# Patient Record
Sex: Male | Born: 2000
Health system: Southern US, Community
[De-identification: ages and names within clinical notes are randomized; demographics above are authoritative.]

---
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
## 2021-04-05 DIAGNOSIS — Z20822 Contact with and (suspected) exposure to covid-19: Secondary | ICD-10-CM | POA: Diagnosis not present

## 2021-04-21 ENCOUNTER — Ambulatory Visit: Payer: 59 | Attending: Family Medicine | Admitting: Pharmacist

## 2021-04-21 ENCOUNTER — Other Ambulatory Visit (HOSPITAL_COMMUNITY): Payer: Self-pay

## 2021-04-21 ENCOUNTER — Other Ambulatory Visit: Payer: Self-pay | Admitting: Pharmacist

## 2021-04-21 ENCOUNTER — Other Ambulatory Visit: Payer: Self-pay

## 2021-04-21 DIAGNOSIS — Z79899 Other long term (current) drug therapy: Secondary | ICD-10-CM

## 2021-04-21 DIAGNOSIS — H3562 Retinal hemorrhage, left eye: Secondary | ICD-10-CM | POA: Diagnosis not present

## 2021-04-21 MED ORDER — DUPIXENT 200 MG/1.14ML ~~LOC~~ SOSY
PREFILLED_SYRINGE | SUBCUTANEOUS | 11 refills | Status: AC
  Filled 2021-04-21: qty 2.28, fill #0

## 2021-04-21 MED ORDER — DUPIXENT 200 MG/1.14ML ~~LOC~~ SOSY: PREFILLED_SYRINGE | SUBCUTANEOUS | 11 refills | Status: DC

## 2021-04-21 NOTE — Progress Notes (Signed)
   S: Patient presents for review of their specialty medication therapy.  Patient is currently taking Dupixent for atopic dermatitis. Patient is managed by Dr. Illene Labrador for this. He has a visit upcoming this Friday (04/24/21) and will discuss potentially coming off of the medication. For now, he is still taking it as prescribed.   Adherence: reported  Efficacy: works well for him.   Dosing: 200 mg [place] every 14 days Dose adjustments: Renal: no dose adjustments (has not been studied) Hepatic: no dose adjustments (has not been studied)  Drug-drug interactions: no interactions present at this time  Screening: TB test: completed  Monitoring: S/sx of infection: none  S/sx of hypersensitivity: none S/sx of ocular effects: none  S/sx of eosinophilia/vasculitis: none   O:  No results found for: WBC, HGB, HCT, MCV, PLT    Chemistry   No results found for: NA, K, CL, CO2, BUN, CREATININE, GLU No results found for: CALCIUM, ALKPHOS, AST, ALT, BILITOT   A/P: 1. Medication review: Patient currently on Dupixent for atopic dermatitis. Reviewed the medication with the patient, including the following: Dupixent is a monoclonal antibody used for the treatment of asthma or atopic dermatitis. Patient educated on purpose, proper use and potential adverse effects of Dupixent. Possible adverse effects include increased risk of infection, ocular effects, vasculitis/eosinophilia, and hypersensitivity reactions. Administer as a SubQ injection and rotate sites. Allow the medication to reach room temp prior to administration (45 mins for 300 mg syringe or 30 min for 200 mg syringe). Do not shake. Discard any unused portion. Will reach out to [facility] to see if we can fax the doctor to switch Dupixent to the pen. No recommendations for any changes.   Butch Penny, PharmD, [place], CPP Clinical Pharmacist [facility] & [facility] [phone]

## 2021-04-24 DIAGNOSIS — L700 Acne vulgaris: Secondary | ICD-10-CM | POA: Diagnosis not present

## 2021-04-24 DIAGNOSIS — L209 Atopic dermatitis, unspecified: Secondary | ICD-10-CM | POA: Diagnosis not present

## 2021-04-29 DIAGNOSIS — L309 Dermatitis, unspecified: Secondary | ICD-10-CM | POA: Diagnosis not present

## 2021-04-29 DIAGNOSIS — Z1159 Encounter for screening for other viral diseases: Secondary | ICD-10-CM | POA: Diagnosis not present

## 2021-04-29 DIAGNOSIS — Z0000 Encounter for general adult medical examination without abnormal findings: Secondary | ICD-10-CM | POA: Diagnosis not present

## 2021-05-13 ENCOUNTER — Other Ambulatory Visit (HOSPITAL_COMMUNITY): Payer: Self-pay

## 2021-05-14 ENCOUNTER — Telehealth (INDEPENDENT_AMBULATORY_CARE_PROVIDER_SITE_OTHER): Payer: Self-pay

## 2021-05-14 NOTE — Telephone Encounter (Signed)
Copied from CRM [id number]. Topic: Appointment Scheduling - Scheduling Inquiry for Clinic >> May 12, 2021 12:26 PM Glean Salen wrote: Reason for WLN:[id number] mother called in to speak with Andrew Rodgers about medication, dupilumab (DUPIXENT) 200 MG/1. prefilled syringe. Please call back

## 2021-05-14 NOTE — Telephone Encounter (Signed)
Call placed to patient's mother. No answer. Left HIPAA-compliant VM with instructions to return my call.

## 2021-05-19 DIAGNOSIS — R748 Abnormal levels of other serum enzymes: Secondary | ICD-10-CM | POA: Diagnosis not present

## 2021-07-23 DIAGNOSIS — Z23 Encounter for immunization: Secondary | ICD-10-CM | POA: Diagnosis not present

## 2021-07-31 DIAGNOSIS — R748 Abnormal levels of other serum enzymes: Secondary | ICD-10-CM | POA: Diagnosis not present

## 2021-08-11 DIAGNOSIS — E875 Hyperkalemia: Secondary | ICD-10-CM | POA: Diagnosis not present

## 2021-08-11 DIAGNOSIS — R748 Abnormal levels of other serum enzymes: Secondary | ICD-10-CM | POA: Diagnosis not present

## 2021-08-12 ENCOUNTER — Other Ambulatory Visit: Payer: Self-pay | Admitting: Adult Medicine

## 2021-08-12 DIAGNOSIS — R748 Abnormal levels of other serum enzymes: Secondary | ICD-10-CM

## 2021-08-14 DIAGNOSIS — R748 Abnormal levels of other serum enzymes: Secondary | ICD-10-CM | POA: Diagnosis not present

## 2021-08-20 ENCOUNTER — Other Ambulatory Visit: Payer: 59

## 2021-08-28 DIAGNOSIS — R748 Abnormal levels of other serum enzymes: Secondary | ICD-10-CM | POA: Diagnosis not present

## 2021-10-14 ENCOUNTER — Other Ambulatory Visit (HOSPITAL_COMMUNITY): Payer: Self-pay

## 2022-01-04 ENCOUNTER — Other Ambulatory Visit (HOSPITAL_COMMUNITY): Payer: Self-pay

## 2022-01-15 ENCOUNTER — Other Ambulatory Visit (HOSPITAL_COMMUNITY): Payer: Self-pay

## 2022-01-20 ENCOUNTER — Other Ambulatory Visit (HOSPITAL_COMMUNITY): Payer: Self-pay

## 2022-01-21 ENCOUNTER — Other Ambulatory Visit (HOSPITAL_COMMUNITY): Payer: Self-pay

## 2022-02-15 ENCOUNTER — Other Ambulatory Visit (HOSPITAL_BASED_OUTPATIENT_CLINIC_OR_DEPARTMENT_OTHER): Payer: Self-pay

## 2022-02-15 MED ORDER — DUPIXENT 200 MG/1.14ML ~~LOC~~ SOAJ: SUBCUTANEOUS | 11 refills | Status: AC

## 2022-03-17 ENCOUNTER — Other Ambulatory Visit (HOSPITAL_COMMUNITY): Payer: Self-pay

## 2022-04-19 ENCOUNTER — Other Ambulatory Visit (HOSPITAL_COMMUNITY): Payer: Self-pay

## 2022-04-21 ENCOUNTER — Other Ambulatory Visit: Payer: Self-pay

## 2022-04-21 DIAGNOSIS — L209 Atopic dermatitis, unspecified: Secondary | ICD-10-CM | POA: Diagnosis not present

## 2022-04-21 MED ORDER — PIMECROLIMUS 1 % EX CREA
TOPICAL_CREAM | CUTANEOUS | 4 refills | Status: AC
  Filled 2022-04-21: qty 30, 20d supply, fill #0

## 2022-04-22 ENCOUNTER — Other Ambulatory Visit: Payer: Self-pay

## 2022-04-23 ENCOUNTER — Other Ambulatory Visit: Payer: Self-pay

## 2022-05-07 ENCOUNTER — Other Ambulatory Visit: Payer: Self-pay

## 2022-06-18 DIAGNOSIS — R220 Localized swelling, mass and lump, head: Secondary | ICD-10-CM | POA: Diagnosis not present

## 2022-06-25 DIAGNOSIS — L728 Other follicular cysts of the skin and subcutaneous tissue: Secondary | ICD-10-CM | POA: Diagnosis not present

## 2022-07-02 ENCOUNTER — Other Ambulatory Visit (HOSPITAL_COMMUNITY): Payer: Self-pay

## 2024-03-12 ENCOUNTER — Other Ambulatory Visit: Payer: Self-pay

## 2024-03-12 MED ORDER — TACROLIMUS 0.1 % EX OINT
1.0000 | TOPICAL_OINTMENT | Freq: Two times a day (BID) | CUTANEOUS | 5 refills | Status: AC
  Filled 2024-03-12 – 2024-05-08 (×4): qty 60, 30d supply, fill #0

## 2024-03-23 ENCOUNTER — Other Ambulatory Visit: Payer: Self-pay

## 2024-03-26 ENCOUNTER — Other Ambulatory Visit: Payer: Self-pay

## 2024-04-20 ENCOUNTER — Other Ambulatory Visit: Payer: Self-pay

## 2024-05-08 ENCOUNTER — Other Ambulatory Visit: Payer: Self-pay
